# Patient Record
Sex: Male | Born: 1995
Health system: Southern US, Community
[De-identification: ages and names within clinical notes are randomized; demographics above are authoritative.]

---
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
## 2021-04-24 ENCOUNTER — Other Ambulatory Visit: Payer: Self-pay

## 2021-04-24 ENCOUNTER — Ambulatory Visit
Admission: RE | Admit: 2021-04-24 | Discharge: 2021-04-24 | Disposition: A | Discharge status: Home / Self Care | Payer: 59 | Source: Ambulatory Visit | Attending: Internal Medicine | Admitting: Internal Medicine

## 2021-04-24 VITALS — BP 117/64 | HR 88 | Temp 98.5°F | Resp 18 | Ht 72.0 in | Wt 185.0 lb

## 2021-04-24 DIAGNOSIS — J069 Acute upper respiratory infection, unspecified: Secondary | ICD-10-CM | POA: Diagnosis not present

## 2021-04-24 MED ORDER — PSEUDOEPHEDRINE HCL ER 120 MG PO TB12: 120.0000 mg | ORAL_TABLET | Freq: Two times a day (BID) | ORAL | 0 refills | Status: DC

## 2021-04-24 NOTE — ED Triage Notes (Signed)
Pt c/o sinus pressure, headache, cough x5-6days  Pt wife is a Building surveyor and her and their daughter were sick and negative for flu and covid.

## 2021-04-24 NOTE — ED Provider Notes (Addendum)
[facility]    CSN: [id number] Arrival date & time: 04/24/21  0951      History   Chief Complaint Chief Complaint  Patient presents with   Nasal Congestion    Appt @ 10   Cough    HPI Mike Perry is a 26 y.o. male who presents with URI symptoms x 5-6 days. His wife works at a day care and had the same thing and tested negative for covid and flu. Has been stuffy with sinus pressure. Denies fever, chills, aches.     History reviewed. No pertinent past medical history.  There are no problems to display for this patient.   History reviewed. No pertinent surgical history.     Home Medications    Prior to Admission medications   Medication Sig Start Date End Date Taking? Authorizing Provider  pseudoephedrine (SUDAFED 12 HOUR) 120 MG 12 hr tablet Take 1 tablet (120 mg total) by mouth 2 (two) times daily. 04/24/21  Yes Rodriguez-Southworth, Viviana Simpler    Family History History reviewed. No pertinent family history.  Social History Social History   Tobacco Use   Smoking status: Former    Types: Cigarettes   Smokeless tobacco: Never  Vaping Use   Vaping Use: Every day  Substance Use Topics   Alcohol use: Yes   Drug use: Never     Allergies   Patient has no allergy information on record.   Review of Systems Review of Systems  Constitutional:  Negative for appetite change, chills, diaphoresis, fatigue and fever.  HENT:  Positive for congestion, postnasal drip, rhinorrhea, sinus pressure and sinus pain. Negative for ear discharge, ear pain, sore throat and trouble swallowing.   Eyes:  Negative for discharge.  Respiratory:  Positive for cough. Negative for chest tightness, shortness of breath and wheezing.   Musculoskeletal:  Negative for myalgias.  Skin:  Negative for rash.  Neurological:  Positive for headaches.    Physical Exam Triage Vital Signs ED Triage Vitals  Enc Vitals Group     BP 04/24/21 1007 117/64     Pulse Rate 04/24/21  1007 88     Resp 04/24/21 1007 18     Temp 04/24/21 1007 98.5 F (36.9 C)     Temp Source 04/24/21 1007 Oral     SpO2 04/24/21 1007 100 %     Weight 04/24/21 1006 185 lb (83.9 kg)     Height 04/24/21 1006 6' (1.829 m)     Head Circumference --      Peak Flow --      Pain Score 04/24/21 1005 3     Pain Loc --      Pain Edu? --      Excl. in GC? --    No data found.  Updated Vital Signs BP 117/64 (BP Location: Left Arm)    Pulse 88    Temp 98.5 F (36.9 C) (Oral)    Resp 18    Ht 6' (1.829 m)    Wt 185 lb (83.9 kg)    SpO2 100%    BMI 25.09 kg/m   Visual Acuity Right Eye Distance:   Left Eye Distance:   Bilateral Distance:    Right Eye Near:   Left Eye Near:    Bilateral Near:      Physical Exam Vitals signs and nursing note reviewed.  Constitutional:      General: he is not in acute distress.    Appearance: Normal appearance. He is  not ill-appearing, toxic-appearing or diaphoretic.  HENT:     Head: Normocephalic.     Right Ear: Tympanic membrane, ear canal and external ear normal.     Left Ear: Tympanic membrane, ear canal and external ear normal.     Nose: with clear mucous and moderate mucosa swelling.      Mouth/Throat: clear    Mouth: Mucous membranes are moist.  Eyes:     General: No scleral icterus.       Right eye: No discharge.        Left eye: No discharge.     Conjunctiva/sclera: Conjunctivae normal.  Neck:     Musculoskeletal: Neck supple. No neck rigidity.  Cardiovascular:     Rate and Rhythm: Normal rate and regular rhythm.     Heart sounds: No murmur.  Pulmonary:     Effort: Pulmonary effort is normal.     Breath sounds: Normal breath sounds.    Musculoskeletal: Normal range of motion.  Lymphadenopathy:     Cervical: No cervical adenopathy.  Skin:    General: Skin is warm and dry.     Coloration: Skin is not jaundiced.     Findings: No rash.  Neurological:     Mental Status: he is alert and oriented to person, place, and time.     Gait:  Gait normal.  Psychiatric:        Mood and Affect: Mood normal.        Behavior: Behavior normal.        Thought Content: Thought content normal.        Judgment: Judgment normal.    UC Treatments / Results  Labs (all labs ordered are listed, but only abnormal results are displayed) Labs Reviewed - No data to display  EKG   Radiology No results found.  Procedures Procedures (including critical care time)  Medications Ordered in UC Medications - No data to display  Initial Impression / Assessment and Plan / UC Course  I have reviewed the triage vital signs and the nursing notes. URI I placed him on Sudafed as noted. See instructions.     Final Clinical Impressions(s) / UC Diagnoses   Final diagnoses:  Upper respiratory tract infection, unspecified type     Discharge Instructions      You just  have a viral cold which may last 2-3 weeks.  Come back if you get a fever of 100.5 or more.       ED Prescriptions     Medication Sig Dispense Auth. Provider   pseudoephedrine (SUDAFED 12 HOUR) 120 MG 12 hr tablet Take 1 tablet (120 mg total) by mouth 2 (two) times daily. 30 tablet Rodriguez-Southworth, Nettie Elm, PA-C      PDMP not reviewed this encounter.   Garey Ham, PA-C 04/24/21 1026    Rodriguez-Southworth, [place], PA-C 04/24/21 1026

## 2021-04-24 NOTE — Discharge Instructions (Signed)
You just  have a viral cold which may last 2-3 weeks.  Come back if you get a fever of 100.5 or more.

## 2021-06-01 ENCOUNTER — Ambulatory Visit: Payer: Self-pay | Admitting: Urology

## 2021-06-21 ENCOUNTER — Ambulatory Visit: Payer: Self-pay | Admitting: Urology

## 2021-07-06 ENCOUNTER — Ambulatory Visit: Payer: Self-pay | Admitting: Urology

## 2021-07-19 ENCOUNTER — Ambulatory Visit (INDEPENDENT_AMBULATORY_CARE_PROVIDER_SITE_OTHER): Payer: 59 | Admitting: Urology

## 2021-07-19 ENCOUNTER — Encounter: Payer: Self-pay | Admitting: Urology

## 2021-07-19 VITALS — BP 127/80 | HR 71 | Ht 72.0 in | Wt 165.0 lb

## 2021-07-19 DIAGNOSIS — Z3009 Encounter for other general counseling and advice on contraception: Secondary | ICD-10-CM | POA: Diagnosis not present

## 2021-07-19 NOTE — Progress Notes (Signed)
? ?  07/19/21 ?3:42 PM  ? ?Mike Perry ?[birth year] ?[id number] ? ?CC: Discuss vasectomy ? ?HPI: ?I saw Mike Perry and his wife today in clinic to discuss vasectomy.  They have 2 children, ages 2 and 4.  He does not desire any further biologic pregnancies, but it is very clear his wife still is interested in another pregnancy.  They were under the impression vasectomy was easily reversible.  He denies any urinary symptoms. ? ? ?Social History:  reports that he has quit smoking. His smoking use included cigarettes. He has been exposed to tobacco smoke. He has never used smokeless tobacco. He reports current alcohol use. He reports that he does not use drugs. ? ?Physical Exam: ?BP 127/80   Pulse 71   Ht 6' (1.829 m)   Wt 165 lb (74.8 kg)   BMI 22.38 kg/m?   ? ?Constitutional:  Alert and oriented, No acute distress. ?Cardiovascular: No clubbing, cyanosis, or edema. ?Respiratory: Normal respiratory effort, no increased work of breathing. ?GI: Abdomen is soft, nontender, nondistended, no abdominal masses ?GU: Vas deferens easily palpable bilaterally, no testicular masses, phallus with patent meatus ? ? ?Assessment & Plan:   ?26 year old male with 2 children ages 2 and 4 who is interested in more information on vasectomy.  After a long conversation today with the patient and his wife, it is clear that they are not quite ready to move forward with vasectomy, and they were under the impression that this was easily reversible. ? ?We discussed the risks and benefits of vasectomy at length.  Vasectomy is intended to be a permanent form of contraception, and does not produce immediate sterility.  Following vasectomy another form of contraception is required until vas occlusion is confirmed by a post-vasectomy semen analysis obtained 2-3 months after the procedure.  Even after vas occlusion is confirmed, vasectomy is not 100% reliable in preventing pregnancy, and the failure rate is approximately 03/1998.  Repeat  vasectomy is required in less than 1% of patients.  He should refrain from ejaculation for 1 week after vasectomy.  Options for fertility after vasectomy include vasectomy reversal, and sperm retrieval with in vitro fertilization or ICSI.  These options are not always successful and may be expensive.  Finally, there are other permanent and non-permanent alternatives to vasectomy available. There is no risk of erectile dysfunction, and the volume of semen will be similar to prior, as the majority of the ejaculate is from the prostate and seminal vesicles.  ? ?The procedure takes ~20 minutes.  We recommend patients take 5-10 mg of Valium 30 minutes prior, and he will need a driver post-procedure.  Local anesthetic is injected into the scrotal skin and a small segment of the vas deferens is removed, and the ends occluded. The complication rate is approximately 1-2%, and includes bleeding, infection, and development of chronic scrotal pain. ? ?Follow-up with urology as needed, can consider vasectomy in the future if both patient and his partner are in agreement to pursue vasectomy ? ?Legrand Rams, MD ?07/19/2021 ? ?[facility] ?[address], [address] ?[place], [place] [address] ?[phone] ? ? ?

## 2021-07-19 NOTE — Patient Instructions (Signed)
Vasectomy Vasectomy is a procedure in which the vas deferens is cut and then tied or burned (cauterized). The vas deferens is a tube that carries sperm from the testicle to the part of the body that drains urine from the bladder (urethra). This procedure blocks sperm from going through the vas deferens and penis during ejaculation. This ensures that sperm does not go into the vagina during sex. Vasectomy does not affect sexual desire or performance and does not prevent sexually transmitted infections. Vasectomy is considered a permanent and very effective form of birth control (contraception). The decision to have a vasectomy should not be made during a stressful time, such as after the loss of a pregnancy or a divorce. You and your partner should decide on whether to have a vasectomy when you are sure that you do not want children in the future. Tell a health care provider about: Any allergies you have. All medicines you are taking, including vitamins, herbs, eye drops, creams, and over-the-counter medicines. Any problems you or family members have had with anesthetic medicines. Any blood disorders you have. Any surgeries you have had. Any medical conditions you have. What are the risks? Generally, this is a safe procedure. However, problems may occur, including: Infection. Bleeding and swelling of the scrotum. The scrotum is the sac that contains the testicles, blood vessels, and structures that help deliver sperm and semen. Allergic reactions to medicines. Failure of the procedure to prevent pregnancy. There is a very small chance that the tied or cauterized ends of the vas deferens may reconnect (recanalization). If this happens, you could still make a woman pregnant. Pain in the scrotum that continues after you heal from the procedure. What happens before the procedure? Medicines Ask your health care provider about: Changing or stopping your regular medicines. This is especially important if  you are taking diabetes medicines or blood thinners. Taking medicines such as aspirin and ibuprofen. These medicines can thin your blood. Do not take these medicines unless your health care provider tells you to take them. Taking over-the-counter medicines, vitamins, herbs, and supplements. You may be told to take a medicine to help you relax (sedative) a few hours before the procedure. General instructions Do not use any products that contain nicotine or tobacco for at least 4 weeks before the procedure. These products include cigarettes, e-cigarettes, and chewing tobacco. If you need help quitting, ask your health care provider. Plan to have a responsible adult take you home from the hospital or clinic. If you will be going home right after the procedure, plan to have a responsible adult care for you for the time you are told. This is important. Ask your health care provider: How your surgery site will be marked. What steps will be taken to help prevent infection. These steps may include: Removing hair at the surgery site. Washing skin with a germ-killing soap. Taking antibiotic medicine. What happens during the procedure?  You will be given one or more of the following: A sedative, unless you were told to take this a few hours before the procedure. A medicine to numb the area (local anesthetic). Your health care provider will feel, or palpate, for your vas deferens. To reach the vas deferens, one of two methods may be used: A very small incision may be made in your scrotum. A punctured opening may be made in your scrotum, without an incision. Your vas deferens will be pulled out of your scrotum and cut. Then, the vas deferens will be closed   in one of two ways: Tied at the ends. Cauterized at the ends to seal them off. The vas deferens will be put back into your scrotum. The incision or puncture opening will be closed with absorbable stitches (sutures). The sutures will eventually  dissolve and will not need to be removed after the procedure. The procedure will be repeated on the other side of your scrotum. The procedure may vary among health care providers and hospitals. What happens after the procedure? You will be monitored to make sure that you do not have problems. You will be asked not to ejaculate for at least 1 week after the procedure, or for as long as you are told. You will need to use a different form of contraception for 2-4 months after the procedure, until you have test results confirming that there are no sperm in your semen. You may be given scrotal support to wear, such as a jockstrap or underwear with a supportive pouch. If you were given a sedative during the procedure, it can affect you for several hours. Do not drive or operate machinery until your health care provider says that it is safe. Summary Vasectomy blocks sperm from being released during ejaculation. This procedure is considered a permanent and very effective form of birth control. Your scrotum will be numbed with medicine (local anesthetic) for the procedure. After the procedure, you will be asked not to ejaculate for at least 1 week, or for as long as you are told. You will also need to use a different form of contraception until your test results confirm that there are no sperm in your semen. This information is not intended to replace advice given to you by your health care provider. Make sure you discuss any questions you have with your health care provider. Document Revised: 07/30/2019 Document Reviewed: 07/30/2019 Elsevier Patient Education  2023 [employer] Inc[employer]  

## 2021-10-25 ENCOUNTER — Ambulatory Visit (INDEPENDENT_AMBULATORY_CARE_PROVIDER_SITE_OTHER): Payer: BLUE CROSS/BLUE SHIELD | Admitting: Urology

## 2021-10-25 DIAGNOSIS — Z308 Encounter for other contraceptive management: Secondary | ICD-10-CM

## 2021-10-25 DIAGNOSIS — Z302 Encounter for sterilization: Secondary | ICD-10-CM

## 2021-10-25 NOTE — Patient Instructions (Signed)

## 2021-10-25 NOTE — Progress Notes (Signed)
VASECTOMY PROCEDURE NOTE:  The patient was taken to the minor procedure room and placed in the supine position. His genitals were prepped and draped in the usual sterile fashion. The right vas deferens was brought up to the skin of the right upper scrotum. The skin overlying it was anesthetized with 1% lidocaine without epinephrine, anesthetic was also injected alongside the vas deferens in the direction of the inguinal canal. The no scalpel vasectomy instrument was used to make a small perforation in the scrotal skin. The vasectomy clamp was used to grasp the vas deferens. It was carefully dissected free from surrounding structures. A 1cm segment of the vas was removed, and the cut ends of the mucosa were cauterized. No significant bleeding was noted. The vas deferens was returned to the scrotum. The skin incision was closed with a simple interrupted stitch of 4-0 chromic.  Attention was then turned to the left side. The left vasectomy was performed in the same exact fashion. Sterile dressings were placed over each incision. The patient tolerated the procedure well.  IMPRESSION/DIAGNOSIS: The patient is a 26 year old gentleman who underwent a vasectomy today. Post-procedure instructions were reviewed. I stressed the importance of continuing to use birth control until he provides a semen specimen more than 2 months from now that demonstrates azoospermia.  We discussed return precautions including fever over 101, significant bleeding or hematoma, or uncontrolled pain. I also stressed the importance of avoiding strenuous activity for one week, no sexual activity or ejaculations for 5 days, intermittent icing over the next 48 hours, and scrotal support.   PLAN: The patient will be advised of his semen analysis results when available.  Legrand Rams, MD 10/25/2021

## 2022-01-25 ENCOUNTER — Other Ambulatory Visit: Payer: 59

## 2022-01-25 DIAGNOSIS — Z308 Encounter for other contraceptive management: Secondary | ICD-10-CM

## 2022-01-26 LAB — POST-VAS SPERM EVALUATION,QUAL: Volume: 0.3 mL

## 2022-02-05 ENCOUNTER — Telehealth: Payer: Self-pay

## 2022-02-05 NOTE — Telephone Encounter (Signed)
-----   Message from Sondra Come, MD sent at 01/29/2022 10:24 AM EST ----- Sperm still present, encourage ejaculations to clear residual sperm, repeat more sensitive semen analysis in 6 weeks  Legrand Rams, MD 01/29/2022

## 2022-02-05 NOTE — Telephone Encounter (Signed)
Called pt informed him of the information below. Pt voiced understanding. Lab requisition can instruction will be placed at reception desk in [place].

## 2022-04-17 ENCOUNTER — Telehealth: Payer: Self-pay | Admitting: *Deleted

## 2022-04-17 NOTE — Telephone Encounter (Signed)
Pt calling stating that he received a bill from a " debt collector" saying he owes $500.00? Pt states he paid $500.00 deposit and thought insurance would cover the rest?? I asked pt where was the bill from? He kept saying debt collector, I advised pt to contact his insurance and ask for EOB for a more detailed report and let us know.

## 2022-04-17 NOTE — Telephone Encounter (Signed)
Spoke with Scientist, water quality and she looked at pt's account and he did not meet his $1500.00 deductible only $ 900 was met and $548.00 is in collections.     Spoke with patient and advised results

## 2022-05-09 ENCOUNTER — Other Ambulatory Visit: Payer: Self-pay | Admitting: Family Medicine

## 2022-05-09 DIAGNOSIS — Z9852 Vasectomy status: Secondary | ICD-10-CM

## 2022-05-10 ENCOUNTER — Other Ambulatory Visit: Payer: 59

## 2023-03-26 ENCOUNTER — Ambulatory Visit: Payer: 59
